# Patient Record
Sex: Male | Born: 1985 | Race: White | Hispanic: No | Marital: Single | Smoking: Current every day smoker
Health system: Southern US, Community
[De-identification: ages and names within clinical notes are randomized; demographics above are authoritative.]

## PROBLEM LIST (undated history)

## (undated) DIAGNOSIS — K219 Gastro-esophageal reflux disease without esophagitis: Secondary | ICD-10-CM

## (undated) HISTORY — PX: APPENDECTOMY: SHX54

## (undated) HISTORY — PX: WISDOM TOOTH EXTRACTION: SHX21

## (undated) HISTORY — DX: Gastro-esophageal reflux disease without esophagitis: K21.9

## (undated) HISTORY — PX: TONSILLECTOMY: SUR1361

---
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
## 2005-02-10 ENCOUNTER — Ambulatory Visit: Payer: Self-pay | Admitting: Internal Medicine

## 2005-02-15 ENCOUNTER — Ambulatory Visit: Payer: Self-pay | Admitting: Family Medicine

## 2005-03-10 ENCOUNTER — Ambulatory Visit: Payer: Self-pay | Admitting: Family Medicine

## 2005-03-15 ENCOUNTER — Ambulatory Visit: Payer: Self-pay | Admitting: Internal Medicine

## 2005-03-24 ENCOUNTER — Ambulatory Visit: Payer: Self-pay | Admitting: *Deleted

## 2005-03-27 ENCOUNTER — Ambulatory Visit: Payer: Self-pay | Admitting: *Deleted

## 2005-03-29 ENCOUNTER — Ambulatory Visit: Payer: Self-pay | Admitting: Internal Medicine

## 2005-04-14 ENCOUNTER — Ambulatory Visit: Payer: Self-pay | Admitting: *Deleted

## 2005-04-21 ENCOUNTER — Ambulatory Visit: Payer: Self-pay | Admitting: *Deleted

## 2005-04-28 ENCOUNTER — Ambulatory Visit: Payer: Self-pay | Admitting: *Deleted

## 2005-05-09 ENCOUNTER — Emergency Department (HOSPITAL_COMMUNITY): Admission: EM | Admit: 2005-05-09 | Discharge: 2005-05-09 | Payer: Self-pay | Admitting: Emergency Medicine

## 2005-05-09 ENCOUNTER — Ambulatory Visit: Payer: Self-pay | Admitting: *Deleted

## 2005-05-18 ENCOUNTER — Ambulatory Visit: Payer: Self-pay | Admitting: Internal Medicine

## 2005-06-19 ENCOUNTER — Ambulatory Visit: Payer: Self-pay | Admitting: Family Medicine

## 2005-09-11 ENCOUNTER — Emergency Department (HOSPITAL_COMMUNITY): Admission: AC | Admit: 2005-09-11 | Discharge: 2005-09-11 | Payer: Self-pay

## 2006-06-30 ENCOUNTER — Emergency Department (HOSPITAL_COMMUNITY): Admission: EM | Admit: 2006-06-30 | Discharge: 2006-06-30 | Payer: Self-pay | Admitting: Emergency Medicine

## 2015-12-23 ENCOUNTER — Encounter: Payer: Self-pay | Admitting: Internal Medicine

## 2016-02-18 ENCOUNTER — Other Ambulatory Visit (INDEPENDENT_AMBULATORY_CARE_PROVIDER_SITE_OTHER): Payer: BLUE CROSS/BLUE SHIELD

## 2016-02-18 ENCOUNTER — Ambulatory Visit (INDEPENDENT_AMBULATORY_CARE_PROVIDER_SITE_OTHER): Payer: BLUE CROSS/BLUE SHIELD | Admitting: Internal Medicine

## 2016-02-18 ENCOUNTER — Encounter: Payer: Self-pay | Admitting: Internal Medicine

## 2016-02-18 VITALS — BP 110/80 | HR 68 | Ht 68.0 in | Wt 169.0 lb

## 2016-02-18 DIAGNOSIS — R1031 Right lower quadrant pain: Secondary | ICD-10-CM | POA: Diagnosis not present

## 2016-02-18 DIAGNOSIS — R152 Fecal urgency: Secondary | ICD-10-CM

## 2016-02-18 DIAGNOSIS — R10813 Right lower quadrant abdominal tenderness: Secondary | ICD-10-CM

## 2016-02-18 LAB — COMPREHENSIVE METABOLIC PANEL
ALBUMIN: 4.6 g/dL (ref 3.5–5.2)
ALK PHOS: 53 U/L (ref 39–117)
ALT: 13 U/L (ref 0–53)
AST: 15 U/L (ref 0–37)
BILIRUBIN TOTAL: 0.7 mg/dL (ref 0.2–1.2)
BUN: 17 mg/dL (ref 6–23)
CALCIUM: 9.4 mg/dL (ref 8.4–10.5)
CO2: 29 mEq/L (ref 19–32)
Chloride: 107 mEq/L (ref 96–112)
Creatinine, Ser: 0.78 mg/dL (ref 0.40–1.50)
GFR: 124.24 mL/min (ref >60.00)
GLUCOSE: 97 mg/dL (ref 70–99)
Potassium: 4.1 mEq/L (ref 3.5–5.1)
Sodium: 141 mEq/L (ref 135–145)
TOTAL PROTEIN: 7.2 g/dL (ref 6.0–8.3)

## 2016-02-18 LAB — CBC WITH DIFFERENTIAL/PLATELET
BASOS ABS: 0 10*3/uL (ref 0.0–0.1)
Basophils Relative: 0.6 % (ref 0.0–3.0)
EOS PCT: 1.4 % (ref 0.0–5.0)
Eosinophils Absolute: 0.1 10*3/uL (ref 0.0–0.7)
HEMATOCRIT: 43.5 % (ref 39.0–52.0)
HEMOGLOBIN: 15 g/dL (ref 13.0–17.0)
LYMPHS PCT: 36.8 % (ref 12.0–46.0)
Lymphs Abs: 2.6 10*3/uL (ref 0.7–4.0)
MCHC: 34.4 g/dL (ref 30.0–36.0)
MCV: 91.3 fl (ref 78.0–100.0)
MONOS PCT: 11.2 % (ref 3.0–12.0)
Monocytes Absolute: 0.8 10*3/uL (ref 0.1–1.0)
NEUTROS PCT: 50 % (ref 43.0–77.0)
Neutro Abs: 3.5 10*3/uL (ref 1.4–7.7)
Platelets: 308 10*3/uL (ref 150.0–400.0)
RBC: 4.76 Mil/uL (ref 4.22–5.81)
RDW: 12.9 % (ref 11.5–15.5)
WBC: 7 10*3/uL (ref 4.0–10.5)

## 2016-02-18 LAB — C-REACTIVE PROTEIN

## 2016-02-18 LAB — SEDIMENTATION RATE: Sed Rate: 9 mm/hr (ref 0–22)

## 2016-02-18 MED ORDER — DICYCLOMINE HCL 20 MG PO TABS: 20.0000 mg | ORAL_TABLET | Freq: Four times a day (QID) | ORAL | Status: DC | PRN

## 2016-02-18 NOTE — Patient Instructions (Addendum)
You have been given a separate informational sheet regarding your tobacco use, the importance of quitting and local resources to help you quit.  Your physician has requested that you go to the basement for lab work before leaving today.   We have sent the following medications to your pharmacy for you to pick up at your convenience: Dicyclomine   You have been scheduled for a CT scan of the abdomen and pelvis at [facility] ([address]---this is in the same building as Press [facility]).   You are scheduled on ___2/28/17____________ at ________2:00pm________________. You should arrive 15 minutes prior to your appointment time for registration. Please follow the written instructions below on the day of your exam:  WARNING: IF YOU ARE ALLERGIC TO IODINE/X-RAY DYE, PLEASE NOTIFY RADIOLOGY IMMEDIATELY AT [phone]! YOU WILL BE GIVEN A 13 HOUR PREMEDICATION PREP.  1) Do not eat or drink anything after __10:00Am_____ (4 hours prior to your test) 2) You have been given 2 bottles of oral contrast to drink. The solution may taste    better if refrigerated, but do NOT add ice or any other liquid to this solution. Shake  well before drinking.    Drink 1 bottle of contrast @ ___12:00pm______ (2 hours prior to your exam)  Drink 1 bottle of contrast @ ____1:00pm_______( 1hour prior to exam)  You may take any medications as prescribed with a small amount of water except for the following: Metformin, Glucophage, Glucovance, Avandamet, Riomet, Fortamet, Actoplus Met, Janumet, Glumetza or Metaglip. The above medications must be held the day of the exam AND 48 hours after the exam.  The purpose of you drinking the oral contrast is to aid in the visualization of your intestinal tract. The contrast solution may cause some diarrhea. Before your exam is started, you will be given a small amount of fluid to drink. Depending on your individual set of symptoms, you may also receive an intravenous  injection of x-ray contrast/dye. Plan on being at [facility] for 30 minutes or longer, depending on the type of exam you are having performed.  This test typically takes 30-45 minutes to complete.  If you have any questions regarding your exam or if you need to reschedule, you may call the CT department at [phone] between the hours of 8:00 am and 5:00 pm, Monday-Friday.  ________________________________________________________________________   I appreciate the opportunity to care for you. Silvano Rusk, MD, [facility]

## 2016-02-18 NOTE — Progress Notes (Signed)
   Subjective:    Patient ID: Tristan Harris, male    DOB: January 29, 1986, 30 y.o.   MRN: [id number] Chief complaint: Abdominal pain HPI The patient is a young white man with history of what was thought to be-year-old bowel syndrome and more constipation when had seen him 10 or more years ago. Now he is describing intermittent right lower quadrant pain and achiness. Sometimes worse than others. He'll have problems after he eats with bloating and right lower quadrant pain and he also suffers with urgent defecation at times. No fevers no bleeding. No weight loss. No nausea or vomiting. He has tried some Pepto-Bismol and things like that without much help. Medications, allergies, past medical history, past surgical history, family history and social history are reviewed and updated in the EMR.   Review of Systems All other review of systems negative    Objective:   Physical Exam '@BP'$  110/80 mmHg  Pulse 68  Ht '5\' 8"'$  (1.727 m)  Wt 169 lb (76.658 kg)  BMI 25.70 kg/m2@  General:  Well-developed, well-nourished and in no acute distress Eyes:  anicteric. ENT:   Mouth and posterior pharynx free of lesions.  Neck:   supple w/o thyromegaly or mass.  Lungs: Clear to auscultation bilaterally. Heart:  S1S2, no rubs, murmurs, gallops. Abdomen:  soft, with mildly tender RLQ, ? Slight fullness, no hepatosplenomegaly, hernia, or mass and BS+.   Lymph:  no cervical or supraclavicular adenopathy. Extremities:   no edema, cyanosis or clubbing Skin   no rash. Neuro:  A&O x 3.  Psych:  appropriate mood and  Affect.         Assessment & Plan:  RLQ abdominal pain - Plan: dicyclomine (BENTYL) 20 MG tablet  RLQ abdominal tenderness  Defecation urgency    Sxs could be IBS but tenderness in RLQ makes me ? Crhn's.  CBC, CMET ESR, CRP CT abd/pelvis w/ contrast Dicyclomine 20 q 6 prn

## 2016-02-21 NOTE — Progress Notes (Signed)
Quick Note:  Labs all look NL ______

## 2016-02-22 ENCOUNTER — Ambulatory Visit (INDEPENDENT_AMBULATORY_CARE_PROVIDER_SITE_OTHER)
Admission: RE | Admit: 2016-02-22 | Discharge: 2016-02-22 | Disposition: A | Discharge status: Home / Self Care | Payer: BLUE CROSS/BLUE SHIELD | Source: Ambulatory Visit | Attending: Internal Medicine | Admitting: Internal Medicine

## 2016-02-22 DIAGNOSIS — R10813 Right lower quadrant abdominal tenderness: Secondary | ICD-10-CM

## 2016-02-22 DIAGNOSIS — R1031 Right lower quadrant pain: Secondary | ICD-10-CM | POA: Diagnosis not present

## 2016-02-22 MED ORDER — IOHEXOL 300 MG/ML  SOLN
100.0000 mL | Freq: Once | INTRAMUSCULAR | Status: AC | PRN
  Administered 2016-02-22: 100 mL via INTRAVENOUS

## 2016-02-22 NOTE — Progress Notes (Signed)
Quick Note:  This is ok as were labs Plan dicyclomine prn  F/u me if that is not working  May also try probiotic x 1 month and longer if taking helps ______

## 2017-06-14 IMAGING — CT CT ABD-PELV W/ CM
2 of 4 series · 16 of 46 positions shown, 18 images · IV contrast (OMNIPAQUE 300)
Comparison: None.

CLINICAL DATA: Right lower quadrant abdominal tenderness. No time
course given.

EXAM:
CT ABDOMEN AND PELVIS WITH CONTRAST
TECHNIQUE: Multidetector CT imaging of the abdomen and pelvis was performed
using the standard protocol following bolus administration of
intravenous contrast.
CONTRAST:  100mL OMNIPAQUE IOHEXOL 300 MG/ML  SOLN

[Series 2: abd/ pelvis · axial · 0.79mm/px · z∈[+803,+1223]mm · 13 of 92 slices shown, 15 images]
[im 4/92  soft-tissue]
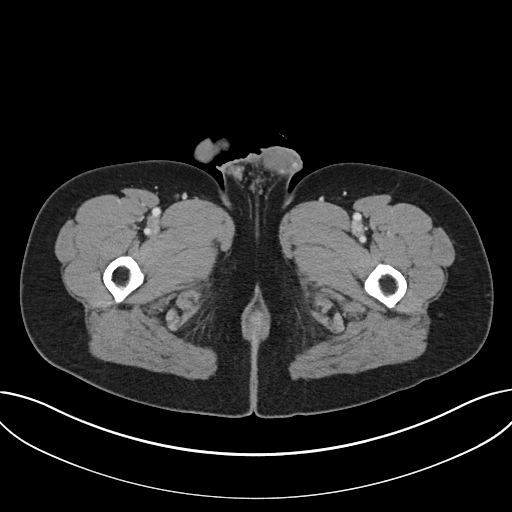
[im 4/92  bone]
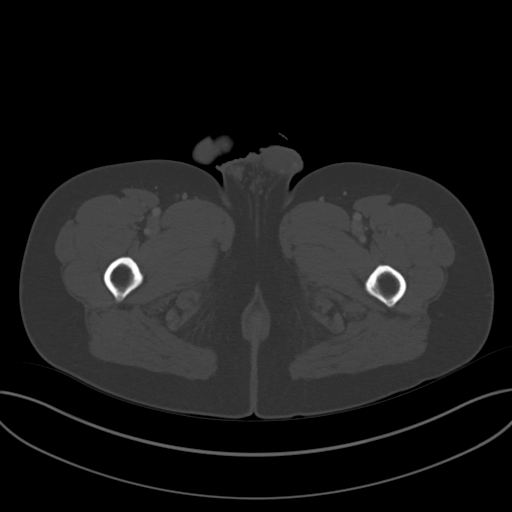
[im 11/92  soft-tissue]
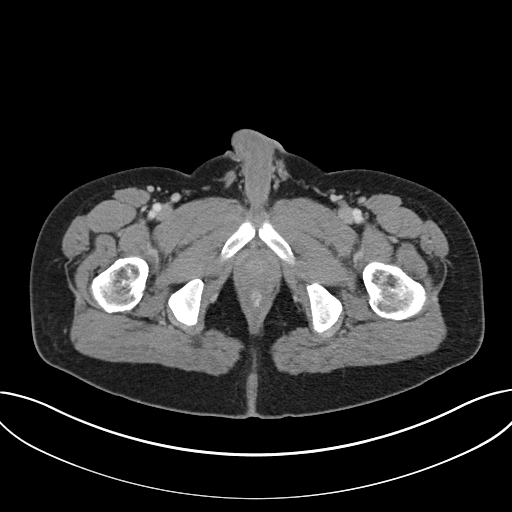
[im 18/92  soft-tissue]
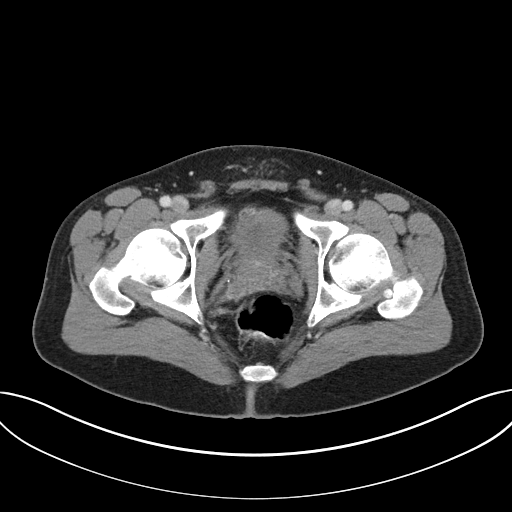
[im 25/92  soft-tissue]
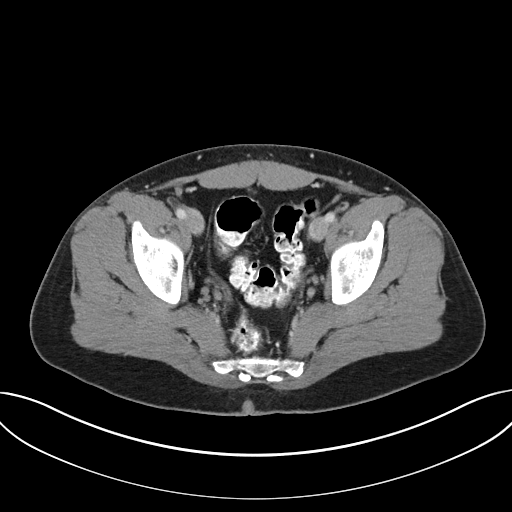
[im 32/92  soft-tissue]
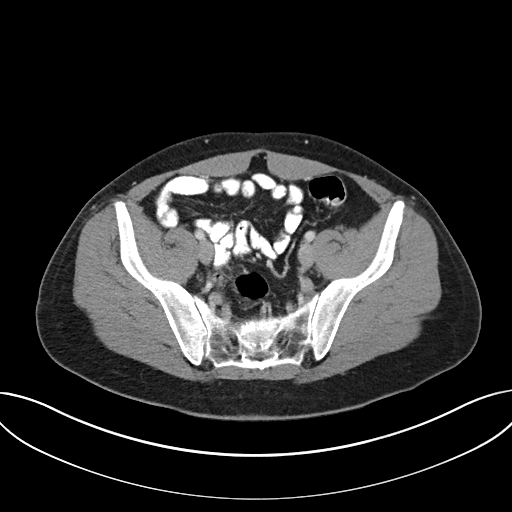
[im 39/92  soft-tissue]
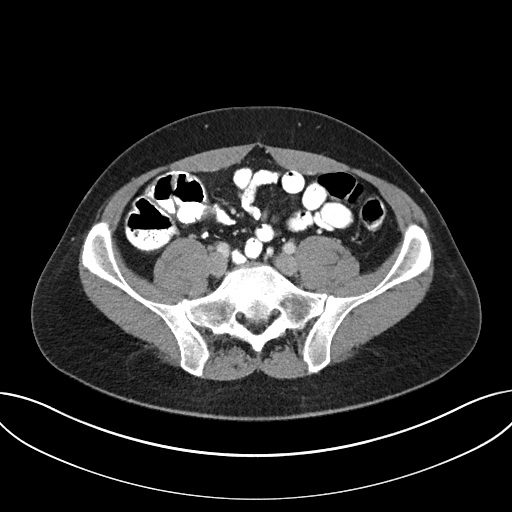
[im 46/92  soft-tissue]
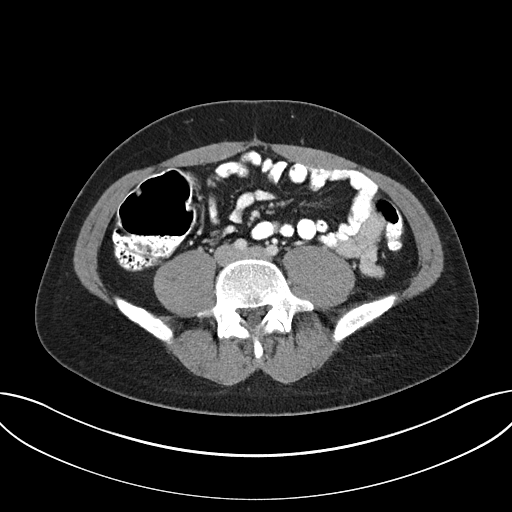
[im 53/92  soft-tissue]
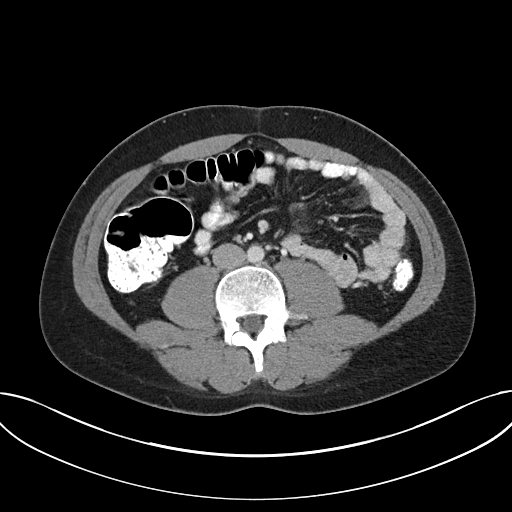
[im 60/92  soft-tissue]
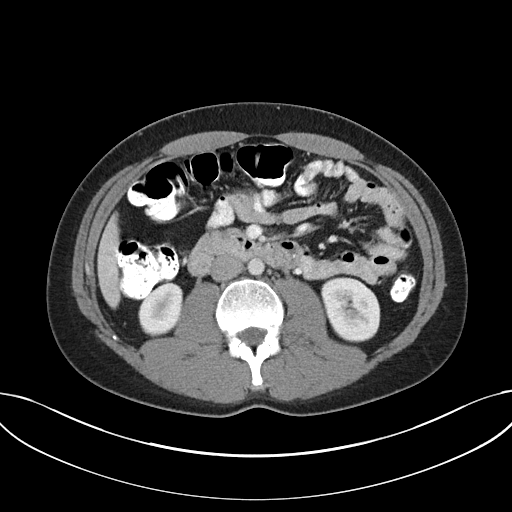
[im 60/92  bone]
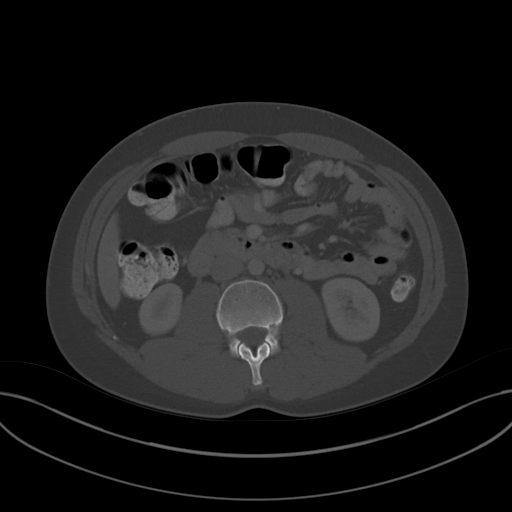
[im 67/92  soft-tissue]
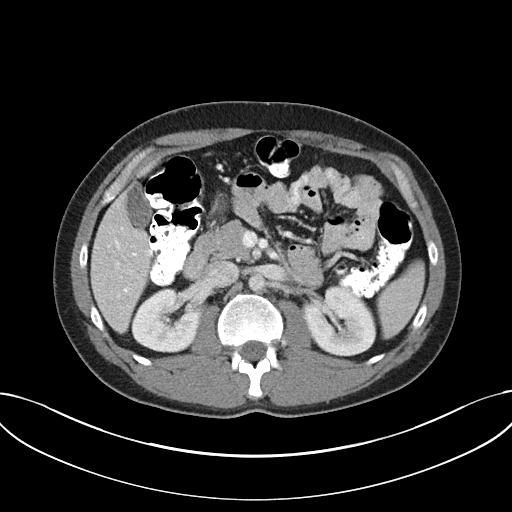
[im 74/92  soft-tissue]
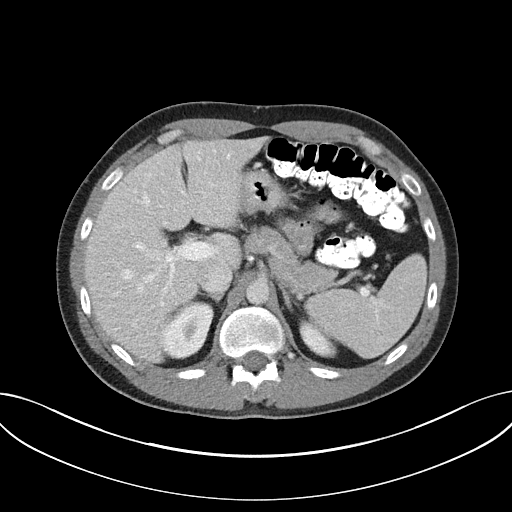
[im 81/92  soft-tissue]
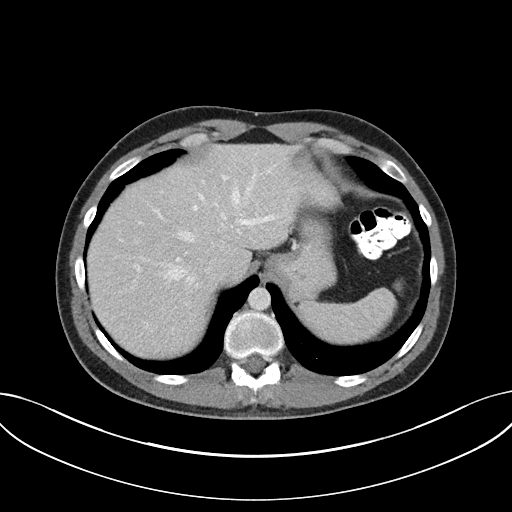
[im 88/92  soft-tissue]
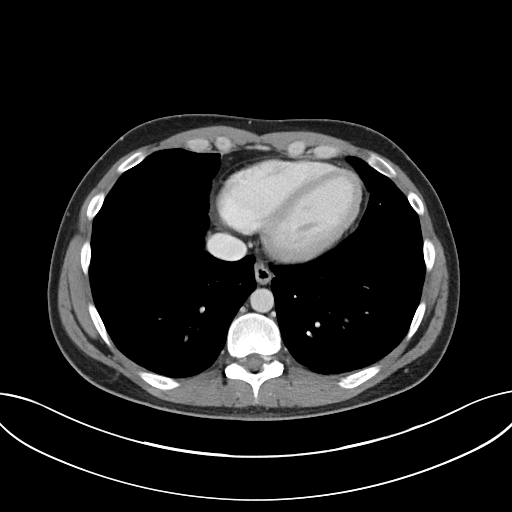

[Series 5: coronal soft tissue · coronal · 0.66mm/px · 3 of 86 slices shown]
[im 29/86  soft-tissue]
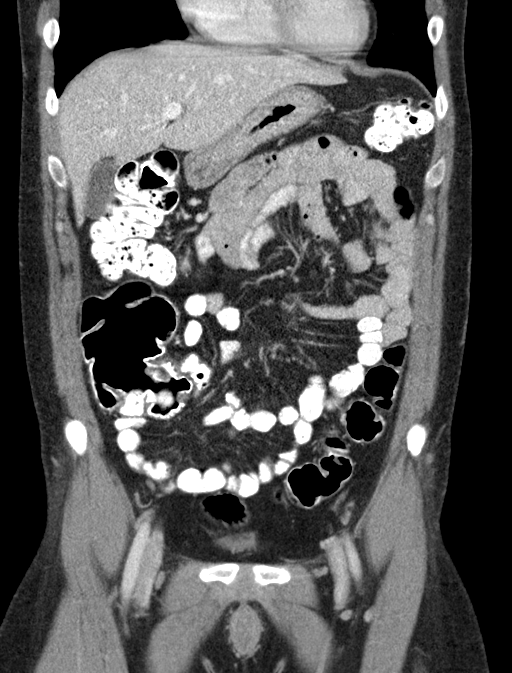
[im 38/86  soft-tissue]
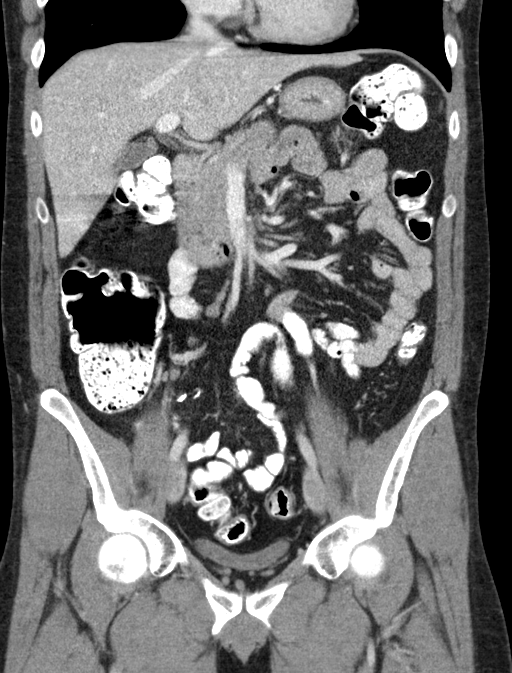
[im 48/86  soft-tissue]
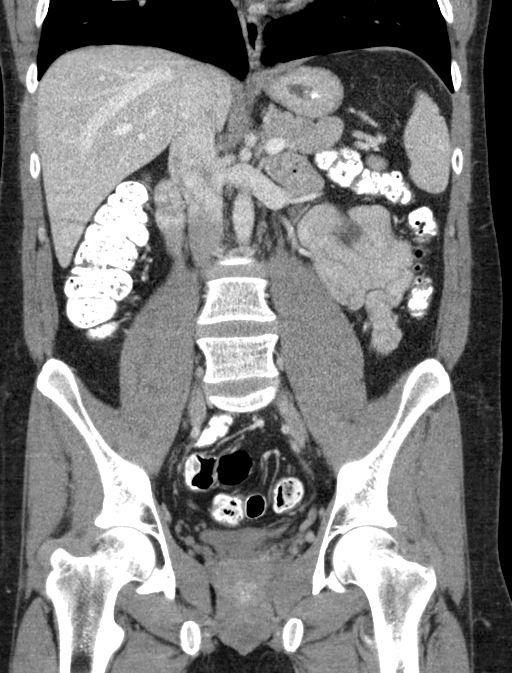

[16 of 46 positions shown; findings below may reference images not displayed]

FINDINGS: Lower chest: The lung bases are clear of acute process. No pleural
effusion or pulmonary lesions. The heart is normal in size. No
pericardial effusion. The distal esophagus and aorta are
unremarkable.

Hepatobiliary: No focal hepatic lesions or intrahepatic biliary
dilatation. The gallbladder is normal. No common bile duct
dilatation.

Pancreas: No mass, inflammation or ductal dilatation.

Spleen: Normal size.  No focal lesions.

Adrenals/Urinary Tract: The adrenal glands are normal.

Both kidneys are normal.

Stomach/Bowel: The stomach, duodenum, small bowel and colon are
unremarkable. No inflammatory changes, mass lesions or obstructive
findings. There are surgical changes from prior appendectomy. There
is a fairly long appendiceal stump measuring approximately 24 mm. No
evidence of acute inflammation. The terminal ileum is normal.

Vascular/Lymphatic: The aorta and branch vessels are patent. The
major venous structures are patent. Small scattered mesenteric and
retroperitoneal lymph nodes but no mass or adenopathy.

Other: The bladder, prostate gland and seminal vesicles are
unremarkable. No pelvic mass or adenopathy. No free pelvic fluid
collections. No inguinal mass or adenopathy. No inguinal hernia.

Musculoskeletal: No significant bony findings.
IMPRESSION: 1. No acute abdominal/pelvic findings, mass lesions or adenopathy.
2. Status post appendectomy. There is a fairly long appendiceal
stump measuring 24 mm. No evidence of acute inflammation.

## 2017-08-25 DIAGNOSIS — S8011XA Contusion of right lower leg, initial encounter: Secondary | ICD-10-CM | POA: Diagnosis not present

## 2018-03-03 DIAGNOSIS — Z202 Contact with and (suspected) exposure to infections with a predominantly sexual mode of transmission: Secondary | ICD-10-CM | POA: Diagnosis not present

## 2018-11-02 DIAGNOSIS — R1031 Right lower quadrant pain: Secondary | ICD-10-CM | POA: Diagnosis not present

## 2018-11-02 DIAGNOSIS — R1011 Right upper quadrant pain: Secondary | ICD-10-CM | POA: Diagnosis not present

## 2018-11-28 DIAGNOSIS — Z113 Encounter for screening for infections with a predominantly sexual mode of transmission: Secondary | ICD-10-CM | POA: Diagnosis not present

## 2018-11-30 DIAGNOSIS — L0100 Impetigo, unspecified: Secondary | ICD-10-CM | POA: Diagnosis not present

## 2018-11-30 DIAGNOSIS — R05 Cough: Secondary | ICD-10-CM | POA: Diagnosis not present

## 2018-12-15 DIAGNOSIS — R05 Cough: Secondary | ICD-10-CM | POA: Diagnosis not present

## 2019-02-12 DIAGNOSIS — J069 Acute upper respiratory infection, unspecified: Secondary | ICD-10-CM | POA: Diagnosis not present

## 2019-02-20 DIAGNOSIS — J0100 Acute maxillary sinusitis, unspecified: Secondary | ICD-10-CM | POA: Diagnosis not present

## 2019-05-08 DIAGNOSIS — Z03818 Encounter for observation for suspected exposure to other biological agents ruled out: Secondary | ICD-10-CM | POA: Diagnosis not present

## 2019-07-25 DIAGNOSIS — Z20828 Contact with and (suspected) exposure to other viral communicable diseases: Secondary | ICD-10-CM | POA: Diagnosis not present

## 2019-11-16 DIAGNOSIS — Z20828 Contact with and (suspected) exposure to other viral communicable diseases: Secondary | ICD-10-CM | POA: Diagnosis not present

## 2020-03-03 DIAGNOSIS — Z0000 Encounter for general adult medical examination without abnormal findings: Secondary | ICD-10-CM | POA: Diagnosis not present

## 2020-03-03 DIAGNOSIS — Z136 Encounter for screening for cardiovascular disorders: Secondary | ICD-10-CM | POA: Diagnosis not present

## 2020-08-27 DIAGNOSIS — F3341 Major depressive disorder, recurrent, in partial remission: Secondary | ICD-10-CM | POA: Diagnosis not present

## 2020-08-27 DIAGNOSIS — Z202 Contact with and (suspected) exposure to infections with a predominantly sexual mode of transmission: Secondary | ICD-10-CM | POA: Diagnosis not present

## 2020-09-27 DIAGNOSIS — R197 Diarrhea, unspecified: Secondary | ICD-10-CM | POA: Diagnosis not present

## 2020-09-27 DIAGNOSIS — F3341 Major depressive disorder, recurrent, in partial remission: Secondary | ICD-10-CM | POA: Diagnosis not present

## 2021-10-21 ENCOUNTER — Encounter: Payer: Self-pay | Admitting: Nurse Practitioner

## 2021-11-09 ENCOUNTER — Other Ambulatory Visit (INDEPENDENT_AMBULATORY_CARE_PROVIDER_SITE_OTHER): Payer: Managed Care, Other (non HMO)

## 2021-11-09 ENCOUNTER — Ambulatory Visit (INDEPENDENT_AMBULATORY_CARE_PROVIDER_SITE_OTHER): Payer: Managed Care, Other (non HMO) | Admitting: Nurse Practitioner

## 2021-11-09 ENCOUNTER — Encounter: Payer: Self-pay | Admitting: Nurse Practitioner

## 2021-11-09 VITALS — BP 102/74 | HR 80 | Ht 69.0 in | Wt 208.0 lb

## 2021-11-09 DIAGNOSIS — K219 Gastro-esophageal reflux disease without esophagitis: Secondary | ICD-10-CM | POA: Diagnosis not present

## 2021-11-09 DIAGNOSIS — R197 Diarrhea, unspecified: Secondary | ICD-10-CM | POA: Diagnosis not present

## 2021-11-09 DIAGNOSIS — K625 Hemorrhage of anus and rectum: Secondary | ICD-10-CM

## 2021-11-09 DIAGNOSIS — R1032 Left lower quadrant pain: Secondary | ICD-10-CM | POA: Diagnosis not present

## 2021-11-09 LAB — CBC WITH DIFFERENTIAL/PLATELET
Basophils Absolute: 0.1 10*3/uL (ref 0.0–0.1)
Basophils Relative: 0.8 % (ref 0.0–3.0)
Eosinophils Absolute: 0.1 10*3/uL (ref 0.0–0.7)
Eosinophils Relative: 1.5 % (ref 0.0–5.0)
HCT: 43.1 % (ref 39.0–52.0)
Hemoglobin: 14.8 g/dL (ref 13.0–17.0)
Lymphocytes Relative: 32.6 % (ref 12.0–46.0)
Lymphs Abs: 2.6 10*3/uL (ref 0.7–4.0)
MCHC: 34.3 g/dL (ref 30.0–36.0)
MCV: 93.1 fl (ref 78.0–100.0)
Monocytes Absolute: 0.7 10*3/uL (ref 0.1–1.0)
Monocytes Relative: 9.1 % (ref 3.0–12.0)
Neutro Abs: 4.5 10*3/uL (ref 1.4–7.7)
Neutrophils Relative %: 56 % (ref 43.0–77.0)
Platelets: 255 10*3/uL (ref 150.0–400.0)
RBC: 4.63 Mil/uL (ref 4.22–5.81)
RDW: 12.7 % (ref 11.5–15.5)
WBC: 8 10*3/uL (ref 4.0–10.5)

## 2021-11-09 LAB — COMPREHENSIVE METABOLIC PANEL
ALT: 30 U/L (ref 0–53)
AST: 19 U/L (ref 0–37)
Albumin: 4.6 g/dL (ref 3.5–5.2)
Alkaline Phosphatase: 79 U/L (ref 39–117)
BUN: 12 mg/dL (ref 6–23)
CO2: 26 mEq/L (ref 19–32)
Calcium: 9.2 mg/dL (ref 8.4–10.5)
Chloride: 107 mEq/L (ref 96–112)
Creatinine, Ser: 0.79 mg/dL (ref 0.40–1.50)
GFR: 114.94 mL/min (ref >60.00)
Glucose, Bld: 111 mg/dL — ABNORMAL HIGH (ref 70–99)
Potassium: 3.5 mEq/L (ref 3.5–5.1)
Sodium: 142 mEq/L (ref 135–145)
Total Bilirubin: 0.3 mg/dL (ref 0.2–1.2)
Total Protein: 7.4 g/dL (ref 6.0–8.3)

## 2021-11-09 LAB — C-REACTIVE PROTEIN: CRP: <1 mg/dL (ref 0.5–20.0)

## 2021-11-09 MED ORDER — OMEPRAZOLE 40 MG PO CPDR: 40.0000 mg | DELAYED_RELEASE_CAPSULE | Freq: Two times a day (BID) | ORAL | 1 refills | Status: DC

## 2021-11-09 MED ORDER — DICYCLOMINE HCL 10 MG PO CAPS: 10.0000 mg | ORAL_CAPSULE | Freq: Three times a day (TID) | ORAL | 1 refills | Status: AC | PRN

## 2021-11-09 NOTE — Progress Notes (Addendum)
11/09/2021 Tristan Harris [id number] 03-25-1986   CHIEF COMPLAINT: Daily heartburn, loose stools and LLQ pain  HISTORY OF PRESENT ILLNESS:  Tristan Harris is a 35 year old male with a past medical history of IBS and GERD. Past appendectomy, tonsillectomy and wisdom tooth extraction. He presents to our office today for further evaluation regarding abdominal pain and loose stools.  He was last seen by Dr. Carlean Purl in office 02/18/2016 due to having RLQ pain which resolved after he took Dicyclomine.  He complains having heartburn daily for the past year.  He took his mother's prescription of famotidine for several months and his symptoms improved.  He then purchased OTC Famotidine which was less effective.  If he skips 1 day of Famotidine his heartburn is worse.  No dysphagia. He endorses having LLQ pain with associated loose stools on and off for the past 6 to 12 months.  He describes passing a soft stool which falls apart followed by 1 or 2 looser stools.  He occasionally sees red blood mixed in his stool which occurs approximately twice monthly over the past 8 months.  He has a history of external hemorrhoids but denies having any anal rectal pain during when he sees blood.  He noticed passing black stools which occurred only after taking Pepto-Bismol.  No antibiotics within the past 6 to 12 months.  No NSAID use. Fatty foods and dairy products worsen his LLQ pain.  He intermittently takes a fiber supplement which results in a firmer stool.  He underwent an colonoscopy by Dr. Carlean Purl 03/29/2005 which showed external hemorrhoids otherwise was normal.  No known family history of IBD or colorectal cancer.  He denies ever having an EGD.  No significant stressors at this time.  Past Medical History:  Diagnosis Date   GERD (gastroesophageal reflux disease)    Past Surgical History:  Procedure Laterality Date   APPENDECTOMY     TONSILLECTOMY     WISDOM TOOTH EXTRACTION    He denies any problems  with sedation/anesthesia or airway management/intubation during any procedure or surgery.  Social History: He is single.  He is a Biomedical scientist.  He smokes 3 to 5 cigarettes daily x 10 years. He drinks 2 beers daily.  Denies drug use.   Family History: Father with heart disease. Mother is healthy. Cousin died from lung cancer.   No Known Allergies   Outpatient Encounter Medications as of 11/09/2021  Medication Sig   famotidine (PEPCID) 20 MG tablet Take 20 mg by mouth 2 (two) times daily.   FIBER PO Take by mouth.   [DISCONTINUED] dicyclomine (BENTYL) 20 MG tablet Take 1 tablet (20 mg total) by mouth every 6 (six) hours as needed for spasms.   No facility-administered encounter medications on file as of 11/09/2021.   REVIEW OF SYSTEMS:  Gen: Denies fever, sweats or chills. No weight loss.  CV: Denies chest pain, palpitations or edema. Resp: Denies cough, shortness of breath of hemoptysis.  GI: See HPI.   GU : Denies urinary burning, blood in urine, increased urinary frequency or incontinence. MS: Denies joint pain, muscles aches or weakness. Derm: Denies rash, itchiness, skin lesions or unhealing ulcers. Psych: Denies depression, anxiety or memory loss. Heme: Denies bruising, bleeding. Neuro:  Denies headaches, dizziness or paresthesias. Endo:  Denies any problems with DM, thyroid or adrenal function.  PHYSICAL EXAM: Ht 5\' 9"  (1.753 m)   Wt 208 lb (94.3 kg)   BMI 30.72 kg/m  BP 102/74   Pulse 80  Ht 5\' 9"  (1.753 m)   Wt 208 lb (94.3 kg)   SpO2 98%   BMI 30.72 kg/m   General: 35 year old male in no acute distress. Head: Normocephalic and atraumatic. Eyes:  Sclerae non-icteric, conjunctive pink. Ears: Normal auditory acuity. Mouth: Dentition intact. No ulcers or lesions.  Neck: Supple, no lymphadenopathy or thyromegaly.  Lungs: Clear bilaterally to auscultation without wheezes, crackles or rhonchi. Heart: Regular rate and rhythm. No murmur, rub or gallop appreciated.  Abdomen:  Soft, non distended. Mild central lower abdominal tenderness without rebound or guarding.  No masses. No hepatosplenomegaly. Normoactive bowel sounds x 4 quadrants.  Rectal: Small noninflamed left external hemorrhoid, small internal hemorrhoids palpated without prolapse. No mass. Melissa CMA present during exam.  Musculoskeletal: Symmetrical with no gross deformities. Skin: Warm and dry. No rash or lesions on visible extremities. Extremities: No edema. Neurological: Alert oriented x 4, no focal deficits.  Psychological:  Alert and cooperative. Normal mood and affect.  ASSESSMENT AND PLAN:  36) 35 year old male with heartburn for the past year which has improved on famotidine but has not abated. 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 EGD to include duodenal biopsies to rule out celiac dz -GERD handout -Omeprazole 40 mg 1 p.o. twice daily for a few weeks, if symptoms improve will decrease to once daily  2) LLQ pain with soft to loose stools, intermittent rectal bleeding.  Colonoscopy in 2006 showed external hemorrhoids otherwise was normal. -Diagnostic colonoscopy to rule out  IBD benefits and risks discussed including risk with sedation, risk of bleeding, perforation and infection  -CBC, CMP and CRP -If patient develops daily diarrhea I will order a GI pathogen panel at that time -Dicyclomine 10 mg 1 p.o. 3 times daily as needed abdominal pain  3) IBS -TTG, IgA ( see addendum above) -Benefiber 1 tablespoon daily if tolerated  Further recommendations to be determined after the above evaluation completed       CC:  No ref. provider found

## 2021-11-09 NOTE — Patient Instructions (Signed)
LABS:  Lab work has been ordered for you today. Our lab is located in the basement. Press "B" on the elevator. The lab is located at the first door on the left as you exit the elevator.  PROCEDURES: You have been scheduled for an EGD and Colonoscopy. Please follow the written instructions given to you at your visit today. If you use inhalers (even only as needed), please bring them with you on the day of your procedure.  MEDICATION: We have sent the following medication to your pharmacy for you to pick up at your convenience: Dicyclomine 10 MG tablet, take 1 tablet 3 times a day if needed for abdominal pain. Omeprazole 40 MG tablet, take 1 tablet twice a day.  RECOMMENDATIONS: Benefiber- 1 tablespoon daily. GERD diet.  It was great seeing you today! Thank you for entrusting me with your care and choosing [facility].  Arnaldo Natal, CRNP  Gastroesophageal Reflux Disease, Adult Gastroesophageal reflux (GER) happens when acid from the stomach flows up into the tube that connects the mouth and the stomach (esophagus). Normally, food travels down the esophagus and stays in the stomach to be digested. With GER, food and stomach acid sometimes move back up into the esophagus. You may have a disease called gastroesophageal reflux disease (GERD) if the reflux: Happens often. Causes frequent or very bad symptoms. Causes problems such as damage to the esophagus. When this happens, the esophagus becomes sore and swollen. Over time, GERD can make small holes (ulcers) in the lining of the esophagus. What are the causes? This condition is caused by a problem with the muscle between the esophagus and the stomach. When this muscle is weak or not normal, it does not close properly to keep food and acid from coming back up from the stomach. The muscle can be weak because of: Tobacco use. Pregnancy. Having a certain type of hernia (hiatal hernia). Alcohol use. Certain foods and  drinks, such as coffee, chocolate, onions, and peppermint. What increases the risk? Being overweight. Having a disease that affects your connective tissue. Taking NSAIDs, such a ibuprofen. What are the signs or symptoms? Heartburn. Difficult or painful swallowing. The feeling of having a lump in the throat. A bitter taste in the mouth. Bad breath. Having a lot of saliva. Having an upset or bloated stomach. Burping. Chest pain. Different conditions can cause chest pain. Make sure you see your doctor if you have chest pain. Shortness of breath or wheezing. A long-term cough or a cough at night. Wearing away of the surface of teeth (tooth enamel). Weight loss. How is this treated? Making changes to your diet. Taking medicine. Having surgery. Treatment will depend on how bad your symptoms are. Follow these instructions at home: Eating and drinking  Follow a diet as told by your doctor. You may need to avoid foods and drinks such as: Coffee and tea, with or without caffeine. Drinks that contain alcohol. Energy drinks and sports drinks. Bubbly (carbonated) drinks or sodas. Chocolate and cocoa. Peppermint and mint flavorings. Garlic and onions. Horseradish. Spicy and acidic foods. These include peppers, chili powder, curry powder, vinegar, hot sauces, and BBQ sauce. Citrus fruit juices and citrus fruits, such as oranges, lemons, and limes. Tomato-based foods. These include red sauce, chili, salsa, and pizza with red sauce. Fried and fatty foods. These include donuts, french fries, potato chips, and high-fat dressings. High-fat meats. These include hot dogs, rib eye steak, sausage, ham, and bacon. High-fat dairy items, such as whole milk,  butter, and cream cheese. Eat small meals often. Avoid eating large meals. Avoid drinking large amounts of liquid with your meals. Avoid eating meals during the 2-3 hours before bedtime. Avoid lying down right after you eat. Do not exercise  right after you eat. Lifestyle  Do not smoke or use any products that contain nicotine or tobacco. If you need help quitting, ask your doctor. Try to lower your stress. If you need help doing this, ask your doctor. If you are overweight, lose an amount of weight that is healthy for you. Ask your doctor about a safe weight loss goal. General instructions Pay attention to any changes in your symptoms. Take over-the-counter and prescription medicines only as told by your doctor. Do not take aspirin, ibuprofen, or other NSAIDs unless your doctor says it is okay. Wear loose clothes. Do not wear anything tight around your waist. Raise (elevate) the head of your bed about 6 inches (15 cm). You may need to use a wedge to do this. Avoid bending over if this makes your symptoms worse. Keep all follow-up visits. Contact a doctor if: You have new symptoms. You lose weight and you do not know why. You have trouble swallowing or it hurts to swallow. You have wheezing or a cough that keeps happening. You have a hoarse voice. Your symptoms do not get better with treatment. Get help right away if: You have sudden pain in your arms, neck, jaw, teeth, or back. You suddenly feel sweaty, dizzy, or light-headed. You have chest pain or shortness of breath. You vomit and the vomit is green, yellow, or black, or it looks like blood or coffee grounds. You faint. Your poop (stool) is red, bloody, or black. You cannot swallow, drink, or eat. These symptoms may represent a serious problem that is an emergency. Do not wait to see if the symptoms will go away. Get medical help right away. Call your local emergency services (911 in the [place] Do not drive yourself to the hospital. Summary If a person has gastroesophageal reflux disease (GERD), food and stomach acid move back up into the esophagus and cause symptoms or problems such as damage to the esophagus. Treatment will depend on how bad your symptoms  are. Follow a diet as told by your doctor. Take all medicines only as told by your doctor. This information is not intended to replace advice given to you by your health care provider. Make sure you discuss any questions you have with your health care provider. Document Revised: 06/21/2020 Document Reviewed: 06/21/2020 Elsevier Patient Education  2022 [employer].

## 2021-11-10 LAB — TISSUE TRANSGLUTAMINASE ABS,IGG,IGA
(tTG) Ab, IgA: <1 U/mL
(tTG) Ab, IgG: <1 U/mL

## 2021-11-10 LAB — IGA: Immunoglobulin A: <5 mg/dL — ABNORMAL LOW (ref 47–310)

## 2021-12-07 ENCOUNTER — Encounter: Payer: Self-pay | Admitting: Internal Medicine

## 2021-12-12 ENCOUNTER — Ambulatory Visit (AMBULATORY_SURGERY_CENTER): Payer: Managed Care, Other (non HMO) | Admitting: Internal Medicine

## 2021-12-12 ENCOUNTER — Encounter: Payer: Self-pay | Admitting: Internal Medicine

## 2021-12-12 VITALS — BP 148/99 | HR 66 | Temp 96.9°F | Resp 19 | Ht 69.0 in | Wt 208.0 lb

## 2021-12-12 DIAGNOSIS — K449 Diaphragmatic hernia without obstruction or gangrene: Secondary | ICD-10-CM | POA: Diagnosis not present

## 2021-12-12 DIAGNOSIS — K625 Hemorrhage of anus and rectum: Secondary | ICD-10-CM

## 2021-12-12 DIAGNOSIS — K319 Disease of stomach and duodenum, unspecified: Secondary | ICD-10-CM

## 2021-12-12 DIAGNOSIS — K219 Gastro-esophageal reflux disease without esophagitis: Secondary | ICD-10-CM

## 2021-12-12 DIAGNOSIS — K648 Other hemorrhoids: Secondary | ICD-10-CM | POA: Diagnosis not present

## 2021-12-12 DIAGNOSIS — K2100 Gastro-esophageal reflux disease with esophagitis, without bleeding: Secondary | ICD-10-CM

## 2021-12-12 DIAGNOSIS — R195 Other fecal abnormalities: Secondary | ICD-10-CM

## 2021-12-12 MED ORDER — OMEPRAZOLE 20 MG PO CPDR: 20.0000 mg | DELAYED_RELEASE_CAPSULE | Freq: Every day | ORAL | 3 refills | Status: AC

## 2021-12-12 MED ORDER — SODIUM CHLORIDE 0.9 % IV SOLN: 500.0000 mL | Freq: Once | INTRAVENOUS | Status: DC

## 2021-12-12 NOTE — Progress Notes (Signed)
Called to room to assist during endoscopic procedure.  Patient ID and intended procedure confirmed with present staff. Received instructions for my participation in the procedure from the performing physician.  

## 2021-12-12 NOTE — Progress Notes (Signed)
To PACU, VSS. Report to [email or web address] 

## 2021-12-12 NOTE — Patient Instructions (Addendum)
I saw changes of acid reflux. Also ? gastritis - biopsied and then also took intestine biopsies to look for Crohn's, celiac and other possible inflammation.  Once biopsies return - I will let you know. It may be January before I am able to communicate this.  In the meantime start taking omeprazole 20 mg daily for acid reflux. It is over the counter.  Acid reflux diet also, please.  I appreciate the opportunity to care for you. Iva Boop, MD, FACG   YOU HAD AN ENDOSCOPIC PROCEDURE TODAY AT THE [facility]:   Refer to the procedure report that was given to you for any specific questions about what was found during the examination.  If the procedure report does not answer your questions, please call your gastroenterologist to clarify.  If you requested that your care partner not be given the details of your procedure findings, then the procedure report has been included in a sealed envelope for you to review at your convenience later.  YOU SHOULD EXPECT: Some feelings of bloating in the abdomen. Passage of more gas than usual.  Walking can help get rid of the air that was put into your GI tract during the procedure and reduce the bloating. If you had a lower endoscopy (such as a colonoscopy or flexible sigmoidoscopy) you may notice spotting of blood in your stool or on the toilet paper. If you underwent a bowel prep for your procedure, you may not have a normal bowel movement for a few days.  Please Note:  You might notice some irritation and congestion in your nose or some drainage.  This is from the oxygen used during your procedure.  There is no need for concern and it should clear up in a day or so.  SYMPTOMS TO REPORT IMMEDIATELY:  Following lower endoscopy (colonoscopy or flexible sigmoidoscopy):  Excessive amounts of blood in the stool  Significant tenderness or worsening of abdominal pains  Swelling of the abdomen that is new, acute  Fever of 100F or  higher  Following upper endoscopy (EGD)  Vomiting of blood or coffee ground material  New chest pain or pain under the shoulder blades  Painful or persistently difficult swallowing  New shortness of breath  Fever of 100F or higher  Black, tarry-looking stools  For urgent or emergent issues, a gastroenterologist can be reached at any hour by calling [phone]. Do not use MyChart messaging for urgent concerns.    DIET:  We do recommend a small meal at first, but then you may proceed to your regular diet.  Drink plenty of fluids but you should avoid alcoholic beverages for 24 hours.  ACTIVITY:  You should plan to take it easy for the rest of today and you should NOT DRIVE or use heavy machinery until tomorrow (because of the sedation medicines used during the test).    FOLLOW UP: Our staff will call the number listed on your records 48-72 hours following your procedure to check on you and address any questions or concerns that you may have regarding the information given to you following your procedure. If we do not reach you, we will leave a message.  We will attempt to reach you two times.  During this call, we will ask if you have developed any symptoms of COVID 19. If you develop any symptoms (ie: fever, flu-like symptoms, shortness of breath, cough etc.) before then, please call [phone].  If you test positive for Covid 19 in the 2  weeks post procedure, please call and report this information to [place].    If any biopsies were taken you will be contacted by phone or by letter within the next 1-3 weeks.  Please call us at [phone] if you have not heard about the biopsies in 3 weeks.    SIGNATURES/CONFIDENTIALITY: You and/or your care partner have signed paperwork which will be entered into your electronic medical record.  These signatures attest to the fact that that the information above on your After Visit Summary has been reviewed and is understood.  Full responsibility of  the confidentiality of this discharge information lies with you and/or your care-partner.

## 2021-12-12 NOTE — Progress Notes (Signed)
Pt's states no medical or surgical changes since previsit or office visit. 

## 2021-12-12 NOTE — Op Note (Signed)
[facility] Patient Name: Tristan Harris Procedure Date: 12/12/2021 2:50 PM MRN: [id number] Endoscopist: Gatha Mayer , MD Age: 35 Referring MD:  Date of Birth: May 26, 1986 Gender: Male Account #: [id number] Procedure:                Colonoscopy Indications:              Rectal bleeding Medicines:                Propofol per Anesthesia, Monitored Anesthesia Care Procedure:                Pre-Anesthesia Assessment:                           - Prior to the procedure, a History and Physical                            was performed, and patient medications and                            allergies were reviewed. The patient's tolerance of                            previous anesthesia was also reviewed. The risks                            and benefits of the procedure and the sedation                            options and risks were discussed with the patient.                            All questions were answered, and informed consent                            was obtained. Prior Anticoagulants: The patient has                            taken no previous anticoagulant or antiplatelet                            agents. ASA Grade Assessment: II - A patient with                            mild systemic disease. After reviewing the risks                            and benefits, the patient was deemed in                            satisfactory condition to undergo the procedure.                           After obtaining informed consent, the colonoscope  was passed under direct vision. Throughout the                            procedure, the patient's blood pressure, pulse, and                            oxygen saturations were monitored continuously. The                            Colonoscope was introduced through the anus and                            advanced to the the terminal ileum, with                            identification of the  appendiceal orifice and IC                            valve. The colonoscopy was performed without                            difficulty. The patient tolerated the procedure                            well. The quality of the bowel preparation was                            good. The terminal ileum, ileocecal valve,                            appendiceal orifice, and rectum were photographed.                            The bowel preparation used was Miralax via split                            dose instruction. Scope In: 3:18:52 PM Scope Out: 3:33:06 PM Scope Withdrawal Time: 0 hours 11 minutes 46 seconds  Total Procedure Duration: 0 hours 14 minutes 14 seconds  Findings:                 The perianal and digital rectal examinations were                            normal.                           A localized area of mucosa in the terminal ileum                            was erythematous and nodular. Biopsies were taken                            with a cold forceps for histology. Verification of  patient identification for the specimen was done.                            Estimated blood loss was minimal.                           External and internal hemorrhoids were found. The                            hemorrhoids were small.                           Biopsies for histology were taken with a cold                            forceps from the right colon and left colon for                            evaluation of microscopic colitis. Complications:            No immediate complications. Estimated Blood Loss:     Estimated blood loss was minimal. Impression:               - Nodular erythematous ileal mucosa. Biopsied.                           - External and internal hemorrhoids.                           - Biopsies were taken with a cold forceps from the                            right colon and left colon for evaluation of                             microscopic colitis. Recommendation:           - Patient has a contact number available for                            emergencies. The signs and symptoms of potential                            delayed complications were discussed with the                            patient. Return to normal activities tomorrow.                            Written discharge instructions were provided to the                            patient.                           - Await pathology results.                           -  See EGD report also Iva Boop, MD 12/12/2021 3:48:04 PM This report has been signed electronically.

## 2021-12-12 NOTE — Op Note (Signed)
[facility] Patient Name: Tristan Harris Procedure Date: 12/12/2021 2:57 PM MRN: [id number] Endoscopist: Iva Boop , MD Age: 35 Referring MD:  Date of Birth: 07-31-1986 Gender: Male Account #: [id number] Procedure:                Upper GI endoscopy Indications:              Heartburn Medicines:                Propofol per Anesthesia, Monitored Anesthesia Care Procedure:                Pre-Anesthesia Assessment:                           - Prior to the procedure, a History and Physical                            was performed, and patient medications and                            allergies were reviewed. The patient's tolerance of                            previous anesthesia was also reviewed. The risks                            and benefits of the procedure and the sedation                            options and risks were discussed with the patient.                            All questions were answered, and informed consent                            was obtained. Prior Anticoagulants: The patient has                            taken no previous anticoagulant or antiplatelet                            agents. ASA Grade Assessment: II - A patient with                            mild systemic disease. After reviewing the risks                            and benefits, the patient was deemed in                            satisfactory condition to undergo the procedure.                           After obtaining informed consent, the endoscope was  passed under direct vision. Throughout the                            procedure, the patient's blood pressure, pulse, and                            oxygen saturations were monitored continuously. The                            Endoscope was introduced through the mouth, and                            advanced to the second part of duodenum. The upper                            GI endoscopy was  accomplished without difficulty.                            The patient tolerated the procedure. Scope In: Scope Out: Findings:                 LA Grade A (one or more mucosal breaks less than 5                            mm, not extending between tops of 2 mucosal folds)                            esophagitis with no bleeding was found in the                            distal esophagus. Biopsies were taken with a cold                            forceps for histology. Biopsies were not recovered                            and this was discovered after scope and bite block                            out and was coughing so not repeated.                           A small sliding hiatal hernia was found.                           Patchy mildly erythematous mucosa without bleeding                            was found in the gastric body and in the gastric                            antrum. Biopsies were taken with a cold forceps for  histology. Verification of patient identification                            for the specimen was done. Estimated blood loss was                            minimal.                           The examined duodenum was normal. Biopsies for                            histology were taken with a cold forceps for                            evaluation of celiac disease. Verification of                            patient identification for the specimen was done.                            Estimated blood loss was minimal.                           The cardia and gastric fundus were normal on                            retroflexion. Complications:            No immediate complications. Estimated Blood Loss:     Estimated blood loss was minimal. Impression:               - LA Grade A reflux esophagitis with no bleeding.                            Biopsied.                           - Small sliding hiatal hernia.                           -  Erythematous mucosa in the gastric body and                            antrum. Biopsied.                           - Normal examined duodenum. Biopsied. Recommendation:           - Patient has a contact number available for                            emergencies. The signs and symptoms of potential                            delayed complications were discussed with the  patient. Return to normal activities tomorrow.                            Written discharge instructions were provided to the                            patient.                           - GERD diet                           d/c famotidine and start omeprazole 20 mg qd OTC                           - Await pathology results.                           - See the other procedure note for documentation of                            additional recommendations. Iva Boop, MD 12/12/2021 3:44:25 PM This report has been signed electronically.

## 2021-12-12 NOTE — Progress Notes (Signed)
[facility] History and Physical   Primary Care Physician:  Soundra Pilon, FNP   Reason for Procedure:   Heartburn, loose stoolks p[ossible celiac  Plan:    EGD, colonoscopy     HPI: Tristan Harris is a 35 y.o. male seen 11/09/21 by Alcide Evener, NP Had heartburn issues and also loose stools - diarrhea + rectal bleeding IgA level was low so TTG Ab unreliable. Also LLQ pain.     Past Medical History:  Diagnosis Date   GERD (gastroesophageal reflux disease)     Past Surgical History:  Procedure Laterality Date   APPENDECTOMY     TONSILLECTOMY     WISDOM TOOTH EXTRACTION      Prior to Admission medications   Medication Sig Start Date End Date Taking? Authorizing Provider  famotidine (PEPCID) 20 MG tablet Take 20 mg by mouth 2 (two) times daily.   Yes [provider]  FIBER PO Take by mouth.   Yes [provider]  dicyclomine (BENTYL) 10 MG capsule Take 1 capsule (10 mg total) by mouth 3 (three) times daily as needed for spasms (abdominal pain). 11/09/21   Arnaldo Natal, NP    Current Outpatient Medications  Medication Sig Dispense Refill   famotidine (PEPCID) 20 MG tablet Take 20 mg by mouth 2 (two) times daily.     FIBER PO Take by mouth.     dicyclomine (BENTYL) 10 MG capsule Take 1 capsule (10 mg total) by mouth 3 (three) times daily as needed for spasms (abdominal pain). 30 capsule 1   Current Facility-Administered Medications  Medication Dose Route Frequency Provider Last Rate Last Admin   0.9 %  sodium chloride infusion  500 mL Intravenous Once Iva Boop, MD        Allergies as of 12/12/2021   (No Known Allergies)    Family History  Problem Relation Age of Onset   Heart disease Father    Lung cancer Cousin    Colon cancer Neg Hx    Stomach cancer Neg Hx    Esophageal cancer Neg Hx    Pancreatic cancer Neg Hx     Social History   Socioeconomic History   Marital status: Single    Spouse  name: Not on file   Number of children: Not on file   Years of education: Not on file   Highest education level: Not on file  Occupational History   Occupation: Chef  Tobacco Use   Smoking status: Every Day   Smokeless tobacco: Never   Tobacco comments:    3-5 a day   Vaping Use   Vaping Use: Never used  Substance and Sexual Activity   Alcohol use: Yes    Alcohol/week: 0.0 standard drinks    Comment: 2 beers daily   Drug use: Yes    Types: Marijuana    Comment: last night had marijuana   Sexual activity: Not on file  Other Topics Concern   Not on file  Social History Narrative   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  Physical Exam: Vital signs BP 140/86    Pulse 78    Temp (!) 96.9 F (36.1 C) (Temporal)    Ht 5\' 9"  (1.753 m)    Wt 208 lb (94.3 kg)    SpO2 96%    BMI 30.72 kg/m  General:   Alert,  Well-developed, well-nourished, pleasant and cooperative in NAD Lungs:  Clear throughout to auscultation.   Heart:  Regular rate and rhythm; no murmurs, clicks, rubs,  or gallops. Abdomen:  Soft, nontender and nondistended. Normal bowel sounds.   Neuro/Psych:  Alert and cooperative. Normal mood and affect. A and O x 3   @Wilfredo Canterbury  , MD, [facility] [phone] (pager) 12/12/2021 2:56 PM@

## 2021-12-14 ENCOUNTER — Telehealth: Payer: Self-pay | Admitting: *Deleted

## 2021-12-14 NOTE — Telephone Encounter (Signed)
°  Follow up Call-  Call back number 12/12/2021  Post procedure Call Back phone  # [phone]  Permission to leave phone message Yes  Some recent data might be hidden     Patient questions:  Do you have a fever, pain , or abdominal swelling? No. Pain Score  0 *  Have you tolerated food without any problems? Yes.    Have you been able to return to your normal activities? Yes.    Do you have any questions about your discharge instructions: Diet   No. Medications  No. Follow up visit  No.  Do you have questions or concerns about your Care? No.  Actions: * If pain score is 4 or above: No action needed, pain <4.  Have you developed a fever since your procedure? no  2.   Have you had an respiratory symptoms (SOB or cough) since your procedure? no  3.   Have you tested positive for COVID 19 since your procedure no  4.   Have you had any family members/close contacts diagnosed with the COVID 19 since your procedure?  no   If yes to any of these questions please route to Laverna Peace, RN and Karlton Lemon, RN

## 2021-12-14 NOTE — Telephone Encounter (Signed)
°  Follow up Call-  Call back number 12/12/2021  Post procedure Call Back phone  # [phone]  Permission to leave phone message Yes  Some recent data might be hidden   No answer, vm not set up

## 2022-02-08 ENCOUNTER — Ambulatory Visit: Payer: Managed Care, Other (non HMO) | Admitting: Internal Medicine
